# Patient Record
Sex: Male | Born: 1975 | Race: Asian | Hispanic: No | Marital: Married | Smoking: Never smoker
Health system: Southern US, Community
[De-identification: ages and names within clinical notes are randomized; demographics above are authoritative.]

---

## 2018-02-09 ENCOUNTER — Encounter (HOSPITAL_COMMUNITY): Payer: Self-pay

## 2018-02-09 ENCOUNTER — Emergency Department (HOSPITAL_COMMUNITY)
Admission: EM | Admit: 2018-02-09 | Discharge: 2018-02-10 | Disposition: A | Discharge status: Home / Self Care | Payer: Self-pay | Attending: Emergency Medicine | Admitting: Emergency Medicine

## 2018-02-09 ENCOUNTER — Emergency Department (HOSPITAL_COMMUNITY): Payer: Self-pay

## 2018-02-09 DIAGNOSIS — Y929 Unspecified place or not applicable: Secondary | ICD-10-CM | POA: Insufficient documentation

## 2018-02-09 DIAGNOSIS — Y939 Activity, unspecified: Secondary | ICD-10-CM | POA: Insufficient documentation

## 2018-02-09 DIAGNOSIS — S0990XA Unspecified injury of head, initial encounter: Secondary | ICD-10-CM | POA: Insufficient documentation

## 2018-02-09 DIAGNOSIS — W01190A Fall on same level from slipping, tripping and stumbling with subsequent striking against furniture, initial encounter: Secondary | ICD-10-CM | POA: Insufficient documentation

## 2018-02-09 DIAGNOSIS — W08XXXA Fall from other furniture, initial encounter: Secondary | ICD-10-CM | POA: Insufficient documentation

## 2018-02-09 DIAGNOSIS — Y999 Unspecified external cause status: Secondary | ICD-10-CM | POA: Insufficient documentation

## 2018-02-09 DIAGNOSIS — R296 Repeated falls: Secondary | ICD-10-CM

## 2018-02-09 LAB — BASIC METABOLIC PANEL
Anion gap: 13 (ref 5–15)
CO2: 25 mmol/L (ref 22–32)
Calcium: 9.1 mg/dL (ref 8.9–10.3)
Chloride: 99 mmol/L — ABNORMAL LOW (ref 101–111)
Creatinine, Ser: 0.93 mg/dL (ref 0.61–1.24)
GFR calc Af Amer: >60 mL/min (ref >60)
GLUCOSE: 104 mg/dL — AB (ref 65–99)
Potassium: 3.5 mmol/L (ref 3.5–5.1)
Sodium: 137 mmol/L (ref 135–145)

## 2018-02-09 LAB — CBC
HEMATOCRIT: 43.9 % (ref 39.0–52.0)
Hemoglobin: 14.7 g/dL (ref 13.0–17.0)
MCH: 33.2 pg (ref 26.0–34.0)
MCHC: 33.5 g/dL (ref 30.0–36.0)
MCV: 99.1 fL (ref 78.0–100.0)
Platelets: 113 10*3/uL — ABNORMAL LOW (ref 150–400)
RBC: 4.43 MIL/uL (ref 4.22–5.81)
RDW: 13.4 % (ref 11.5–15.5)
WBC: 7.3 10*3/uL (ref 4.0–10.5)

## 2018-02-09 LAB — I-STAT TROPONIN, ED: Troponin i, poc: 0 ng/mL (ref 0.00–0.08)

## 2018-02-09 LAB — D-DIMER, QUANTITATIVE: D-Dimer, Quant: 0.27 ug/mL-FEU (ref 0.00–0.50)

## 2018-02-09 NOTE — ED Triage Notes (Signed)
Pt. Arrived by EMS with reports of syncopal episode. Per family pt. Was sitting on the couch and fell and hit the left side of his face on a coffee table. Pt. Does not remember falling and was an unwitnessed fall. C-collar in place. Pt. A/o x4  Pt. Oleh GeninSpeaks Karemai, translator used during triage.  cbg 95

## 2018-02-09 NOTE — ED Notes (Signed)
Pt. Return from XRAY via stretcher. 

## 2018-02-09 NOTE — ED Provider Notes (Signed)
[facility] EMERGENCY DEPARTMENT Provider Note   CSN: [id number] Arrival date & time: 02/09/18  2057     History   Chief Complaint Chief Complaint  Patient presents with  . Loss of Consciousness    HPI Kiyan Burmester is a 42 y.o. male.  Urbano Seivert is a 42 y.o. Male who is otherwise healthy, presents to the emergency department via EMS for evaluation of unwitnessed fall.  Patient was on the couch and fell hitting the left side of his face on a coffee table, patient does not remember falling, family came in shortly after hearing him fall he was found on the floor awake.  Patient has not had any vomiting since hitting his head, is alert and oriented and responsive, complaining primarily of pain around the left cheek and eye where there is notable swelling, no lacerations reported.  Patient denies any pain elsewhere aside from his head and neck.  Patient did not have any prodrome of chest pain or shortness of breath prior to falling.  Patient's son comments that he just flew in from [place] this morning and has not had much sleep and he thinks the patient may have fallen asleep and fallen off the couch.  Patient denies any vision changes, numbness tingling or weakness.  Denies any current chest pain, shortness of breath or abdominal pain.  Patient denies any pain or lower extremity swelling.  Patient speaks Clydie Braun, history obtained with help of son, and interpreter.     History reviewed. No pertinent past medical history.  There are no active problems to display for this patient.   History reviewed. No pertinent surgical history.      Home Medications    Prior to Admission medications   Not on File    Family History History reviewed. No pertinent family history.  Social History Social History   Tobacco Use  . Smoking status: Never Smoker  . Smokeless tobacco: Never Used  Substance Use Topics  . Alcohol use: Not on file  . Drug use: Not on file     Allergies     Patient has no known allergies.   Review of Systems Review of Systems  Constitutional: Negative for chills and fever.  HENT: Positive for facial swelling.   Eyes: Positive for redness. Negative for photophobia and visual disturbance.  Respiratory: Negative for cough and shortness of breath.   Cardiovascular: Negative for chest pain.  Gastrointestinal: Negative for abdominal pain, nausea and vomiting.  Genitourinary: Negative for dysuria and frequency.  Musculoskeletal: Positive for neck pain. Negative for arthralgias, back pain and joint swelling.  Skin: Negative for color change and rash.  Neurological: Positive for headaches. Negative for dizziness, weakness, light-headedness and numbness.     Physical Exam Updated Vital Signs BP (!) 168/98   Pulse 94   Temp 98.6 F (37 C) (Oral)   Resp 17   SpO2 97%   Physical Exam  Constitutional: He is oriented to person, place, and time. He appears well-developed and well-nourished. No distress.  HENT:  Head: Normocephalic.  Tenderness and swelling above and below the left eye, no obvious palpable bony deformity, no swelling or tenderness over the scalp, no appreciable step-off or deformity, no hemotympanum or CSF otorrhea, normal jaw occlusion  Eyes: Right eye exhibits no discharge. Left eye exhibits no discharge.  Small subconjunctival hemorrhage of the left eye around the outer canthus, no evidence of hyphema, right eye unremarkable  Neck:  C-collar in place, focal tenderness over the midline  of the C-spine without palpable deformity  Cardiovascular: Normal rate, regular rhythm, normal heart sounds and intact distal pulses.  Pulmonary/Chest: Effort normal and breath sounds normal. No stridor. No respiratory distress. He has no wheezes. He has no rales.  Respirations equal and unlabored, patient able to speak in full sentences, lungs clear to auscultation bilaterally  Abdominal: Soft. Bowel sounds are normal. He exhibits no distension  and no mass. There is no tenderness. There is no guarding.  Abdomen soft, nondistended, nontender to palpation in all quadrants without guarding or peritoneal signs  Musculoskeletal:  T-spine and L-spine nontender to palpation at midline. Patient moves all extremities without difficulty. All joints supple and easily movable, no erythema, swelling or palpable deformity, all compartments soft.  Neurological: He is alert and oriented to person, place, and time. Coordination normal.  Speech is clear, able to follow commands CN III-XII intact Normal strength in upper and lower extremities bilaterally including dorsiflexion and plantar flexion, strong and equal grip strength Sensation normal to light and sharp touch Moves extremities without ataxia, coordination intact Normal finger to nose and rapid alternating movements No pronator drift  Skin: Skin is warm and dry. Capillary refill takes less than 2 seconds. He is not diaphoretic.  Psychiatric: He has a normal mood and affect. His behavior is normal.  Nursing note and vitals reviewed.    ED Treatments / Results  Labs (all labs ordered are listed, but only abnormal results are displayed) Labs Reviewed  BASIC METABOLIC PANEL - Abnormal; Notable for the following components:      Result Value   Chloride 99 (*)    Glucose, Bld 104 (*)    BUN <5 (*)    All other components within normal limits  CBC - Abnormal; Notable for the following components:   Platelets 113 (*)    All other components within normal limits  URINALYSIS, ROUTINE W REFLEX MICROSCOPIC - Abnormal; Notable for the following components:   Hgb urine dipstick SMALL (*)    Ketones, ur 20 (*)    Bacteria, UA RARE (*)    All other components within normal limits  D-DIMER, QUANTITATIVE (NOT AT [facility])  I-STAT TROPONIN, ED  CBG MONITORING, ED    EKG EKG Interpretation  Date/Time:  Friday February 09 2018 21:04:08 EDT Ventricular Rate:  89 PR Interval:    QRS  Duration: 92 QT Interval:  380 QTC Calculation: 463 R Axis:   100 Text Interpretation:  Sinus rhythm Right axis deviation Confirmed by Rolland Porter ([id number]) on 02/09/2018 9:13:15 PM   Radiology Dg Chest 2 View  Result Date: 02/09/2018 CLINICAL DATA:  Syncopal episode. Fell off the couch onto the left side. Unwitnessed fall. EXAM: CHEST - 2 VIEW COMPARISON:  None. FINDINGS: Normal heart size and pulmonary vascularity. No focal airspace disease or consolidation in the lungs. No blunting of costophrenic angles. No pneumothorax. Mediastinal contours appear intact. IMPRESSION: No active cardiopulmonary disease. Electronically Signed   By: Burman Nieves M.D.   On: 02/09/2018 22:51   Ct Head Wo Contrast  Result Date: 02/10/2018 CLINICAL DATA:  Patient fell over while sitting on the sofa. Struck left side of face. Loss consciousness. EXAM: CT HEAD WITHOUT CONTRAST CT MAXILLOFACIAL WITHOUT CONTRAST CT CERVICAL SPINE WITHOUT CONTRAST TECHNIQUE: Multidetector CT imaging of the head, cervical spine, and maxillofacial structures were performed using the standard protocol without intravenous contrast. Multiplanar CT image reconstructions of the cervical spine and maxillofacial structures were also generated. COMPARISON:  None. FINDINGS: CT HEAD FINDINGS Brain:  No evidence of acute infarction, hemorrhage, hydrocephalus, extra-axial collection or mass lesion/mass effect. Vascular: No hyperdense vessel or unexpected calcification. Skull: Normal. Negative for fracture or focal lesion. Other: None. CT MAXILLOFACIAL FINDINGS Osseous: No fracture or mandibular dislocation. No destructive process. Degenerative changes in the temporomandibular joints. Orbits: Negative. No traumatic or inflammatory finding. Sinuses: Mucosal thickening in the paranasal sinuses. No acute air-fluid levels. Mastoid air cells are clear. Soft tissues: Soft tissue swelling or hematoma over the left anterior maxillary region. CT CERVICAL SPINE  FINDINGS Alignment: Normal. Skull base and vertebrae: No acute fracture. No primary bone lesion or focal pathologic process. Soft tissues and spinal canal: No prevertebral fluid or swelling. No visible canal hematoma. Disc levels: Intervertebral disc space heights are preserved. Hypertrophic degenerative changes most prominent at C5-6, C6-7, and C7-T1 levels. Upper chest: Lung apices are clear. Other: None. IMPRESSION: 1. No acute intracranial abnormalities. 2. No acute orbital or facial fractures. 3. Soft tissue swelling or hematoma over the left anterior maxillary region. 4. Degenerative changes in the temporomandibular joints. 5. Mucosal thickening in the paranasal sinuses, likely chronic. 6. Normal alignment of the cervical spine. No acute displaced fractures identified. Mild degenerative changes. Electronically Signed   By: Burman NievesWilliam  Stevens M.D.   On: 02/10/2018 00:42   Ct Cervical Spine Wo Contrast  Result Date: 02/10/2018 CLINICAL DATA:  Patient fell over while sitting on the sofa. Struck left side of face. Loss consciousness. EXAM: CT HEAD WITHOUT CONTRAST CT MAXILLOFACIAL WITHOUT CONTRAST CT CERVICAL SPINE WITHOUT CONTRAST TECHNIQUE: Multidetector CT imaging of the head, cervical spine, and maxillofacial structures were performed using the standard protocol without intravenous contrast. Multiplanar CT image reconstructions of the cervical spine and maxillofacial structures were also generated. COMPARISON:  None. FINDINGS: CT HEAD FINDINGS Brain: No evidence of acute infarction, hemorrhage, hydrocephalus, extra-axial collection or mass lesion/mass effect. Vascular: No hyperdense vessel or unexpected calcification. Skull: Normal. Negative for fracture or focal lesion. Other: None. CT MAXILLOFACIAL FINDINGS Osseous: No fracture or mandibular dislocation. No destructive process. Degenerative changes in the temporomandibular joints. Orbits: Negative. No traumatic or inflammatory finding. Sinuses: Mucosal  thickening in the paranasal sinuses. No acute air-fluid levels. Mastoid air cells are clear. Soft tissues: Soft tissue swelling or hematoma over the left anterior maxillary region. CT CERVICAL SPINE FINDINGS Alignment: Normal. Skull base and vertebrae: No acute fracture. No primary bone lesion or focal pathologic process. Soft tissues and spinal canal: No prevertebral fluid or swelling. No visible canal hematoma. Disc levels: Intervertebral disc space heights are preserved. Hypertrophic degenerative changes most prominent at C5-6, C6-7, and C7-T1 levels. Upper chest: Lung apices are clear. Other: None. IMPRESSION: 1. No acute intracranial abnormalities. 2. No acute orbital or facial fractures. 3. Soft tissue swelling or hematoma over the left anterior maxillary region. 4. Degenerative changes in the temporomandibular joints. 5. Mucosal thickening in the paranasal sinuses, likely chronic. 6. Normal alignment of the cervical spine. No acute displaced fractures identified. Mild degenerative changes. Electronically Signed   By: Burman NievesWilliam  Stevens M.D.   On: 02/10/2018 00:42   Ct Maxillofacial Wo Contrast  Result Date: 02/10/2018 CLINICAL DATA:  Patient fell over while sitting on the sofa. Struck left side of face. Loss consciousness. EXAM: CT HEAD WITHOUT CONTRAST CT MAXILLOFACIAL WITHOUT CONTRAST CT CERVICAL SPINE WITHOUT CONTRAST TECHNIQUE: Multidetector CT imaging of the head, cervical spine, and maxillofacial structures were performed using the standard protocol without intravenous contrast. Multiplanar CT image reconstructions of the cervical spine and maxillofacial structures were also generated. COMPARISON:  None.  FINDINGS: CT HEAD FINDINGS Brain: No evidence of acute infarction, hemorrhage, hydrocephalus, extra-axial collection or mass lesion/mass effect. Vascular: No hyperdense vessel or unexpected calcification. Skull: Normal. Negative for fracture or focal lesion. Other: None. CT MAXILLOFACIAL FINDINGS  Osseous: No fracture or mandibular dislocation. No destructive process. Degenerative changes in the temporomandibular joints. Orbits: Negative. No traumatic or inflammatory finding. Sinuses: Mucosal thickening in the paranasal sinuses. No acute air-fluid levels. Mastoid air cells are clear. Soft tissues: Soft tissue swelling or hematoma over the left anterior maxillary region. CT CERVICAL SPINE FINDINGS Alignment: Normal. Skull base and vertebrae: No acute fracture. No primary bone lesion or focal pathologic process. Soft tissues and spinal canal: No prevertebral fluid or swelling. No visible canal hematoma. Disc levels: Intervertebral disc space heights are preserved. Hypertrophic degenerative changes most prominent at C5-6, C6-7, and C7-T1 levels. Upper chest: Lung apices are clear. Other: None. IMPRESSION: 1. No acute intracranial abnormalities. 2. No acute orbital or facial fractures. 3. Soft tissue swelling or hematoma over the left anterior maxillary region. 4. Degenerative changes in the temporomandibular joints. 5. Mucosal thickening in the paranasal sinuses, likely chronic. 6. Normal alignment of the cervical spine. No acute displaced fractures identified. Mild degenerative changes. Electronically Signed   By: Burman Nieves M.D.   On: 02/10/2018 00:42    Procedures Procedures (including critical care time)  Medications Ordered in ED Medications - No data to display   Initial Impression / Assessment and Plan / ED Course  I have reviewed the triage vital signs and the nursing notes.  Pertinent labs & imaging results that were available during my care of the patient were reviewed by me and considered in my medical decision making (see chart for details).  Patient is emergency department for evaluation of unwitnessed fall, he hit the left side of his head on a coffee table, possible syncopal episode versus son concerned that he fell asleep and fell over as patient has had very little sleep  today after traveling from [place].  Patient is alert and oriented currently, but does not remember the fall.  He denies any current chest pain or shortness of breath or any preceding the event.  Reports mild headache and pain surrounding the left face, no vision changes, vomiting or dizziness.  Normal neurologic exam, swelling surrounding the left eye no obvious bony deformity, there is a small subconjunctival hemorrhage of the left eye with no evidence of hyphema.  Tenderness to palpation of the C-spine will get CTs of the head, maxillofacial and C-spine.  Labs, EKG and chest x-ray ordered from triage.  Will also get d-dimer given patient's recent plane travel with potential syncopal event.  CT is of the head face and neck show no evidence of significant trauma aside from superficial hematoma over the left cheek.  Labs overall very reassuring, EKG without concerning ischemic changes, negative troponin, negative d-dimer.  No leukocytosis, normal hemoglobin, no acute electrolyte derangements, normal renal function, no evidence of urinary tract infection.  Discussed these reassuring results with patient and family.  Patient able to ambulate in the department without any symptoms or difficulty.  At this time I feel patient is stable for discharge home with outpatient follow-up return precautions discussed.  Patient expresses understanding and is in agreement with plan.  Final Clinical Impressions(s) / ED Diagnoses   Final diagnoses:  Unwitnessed fall  Injury of head, initial encounter    ED Discharge Orders    None       Dartha Lodge, [place]  02/10/18 1310    Rolland Porter, MD 02/16/18 [phone]

## 2018-02-10 LAB — URINALYSIS, ROUTINE W REFLEX MICROSCOPIC
BILIRUBIN URINE: NEGATIVE
GLUCOSE, UA: NEGATIVE mg/dL
KETONES UR: 20 mg/dL — AB
LEUKOCYTES UA: NEGATIVE
NITRITE: NEGATIVE
PROTEIN: NEGATIVE mg/dL
Specific Gravity, Urine: 1.013 (ref 1.005–1.030)
pH: 7 (ref 5.0–8.0)

## 2018-02-10 NOTE — ED Notes (Signed)
Pt ambulated to BR, tolerated well

## 2018-02-10 NOTE — Discharge Instructions (Signed)
Work-up today is very reassuring, lab work, EKG and imaging of the head neck and chest look good.  Patient could have passed out versus fallen asleep causing his fall today.  Please follow-up with the [facility], return to the emergency department for any further episodes of passing out, falls, if he complains of severe headache, vomiting, chest pain, shortness of breath or any other new or concerning symptoms.

## 2019-11-11 IMAGING — CT CT CERVICAL SPINE W/O CM
4 of 10 series · 8 of 33 positions shown, 9 images · non-contrast
Comparison: None.

CLINICAL DATA: Patient fell over while sitting on the sofa. Struck
left side of face. Loss consciousness.

EXAM:
CT HEAD WITHOUT CONTRAST
CT MAXILLOFACIAL WITHOUT CONTRAST
CT CERVICAL SPINE WITHOUT CONTRAST
TECHNIQUE: Multidetector CT imaging of the head, cervical spine, and
maxillofacial structures were performed using the standard protocol
without intravenous contrast. Multiplanar CT image reconstructions
of the cervical spine and maxillofacial structures were also
generated.

[Series 7: facial/ orbits 2.0 h30s · axial · 0.39mm/px · z∈[-134,-84]mm · 2 of 77 slices shown]
[im 26/77  bone]
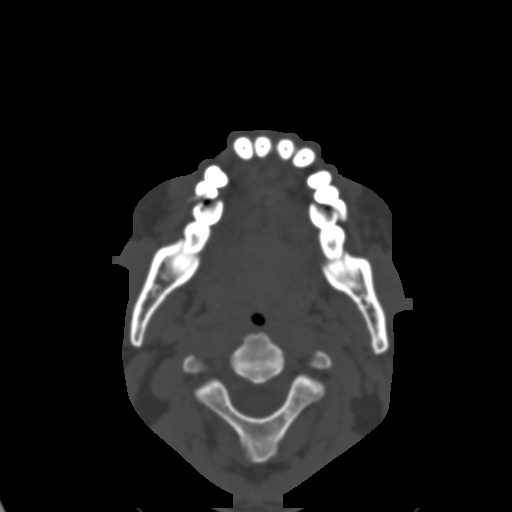
[im 51/77  bone]
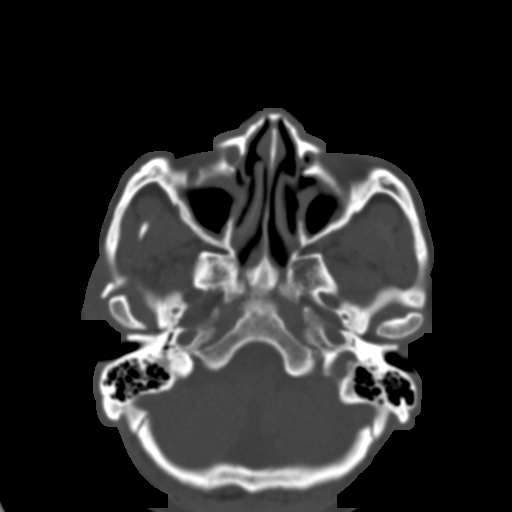

[Series 16: c_spine 2.0 3 st · axial · 0.24mm/px · z∈[-226,-130]mm · 3 of 98 slices shown, 4 images]
[im 25/98  soft-tissue]
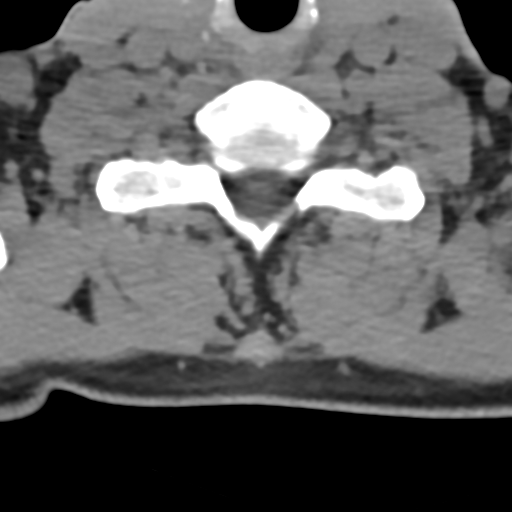
[im 25/98  bone]
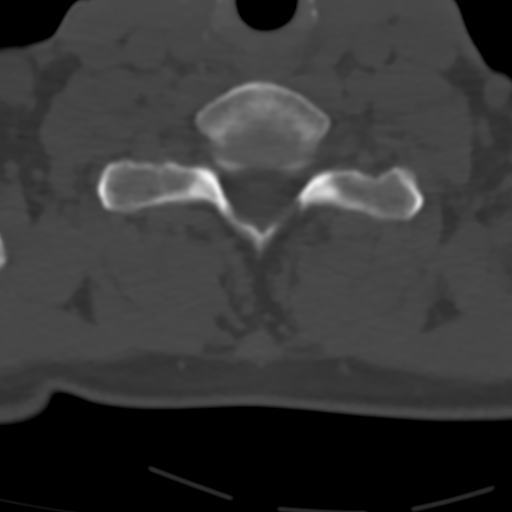
[im 49/98  bone]
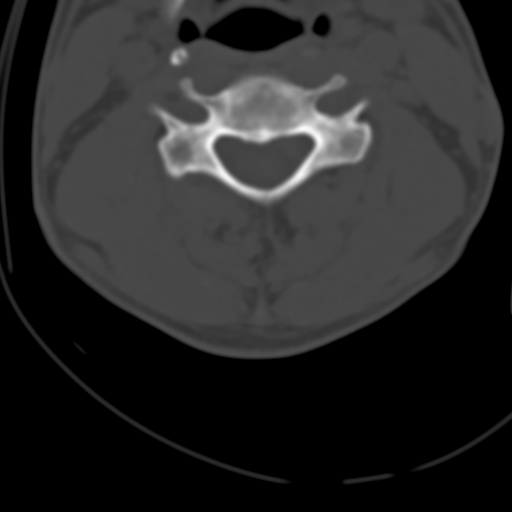
[im 73/98  bone]
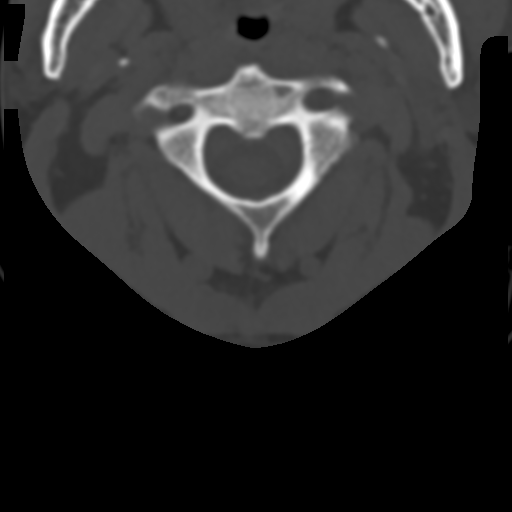

[Series 21: sagittal bone · sagittal · 0.25mm/px · 1 of 43 slices shown]
[im 22/43  bone]
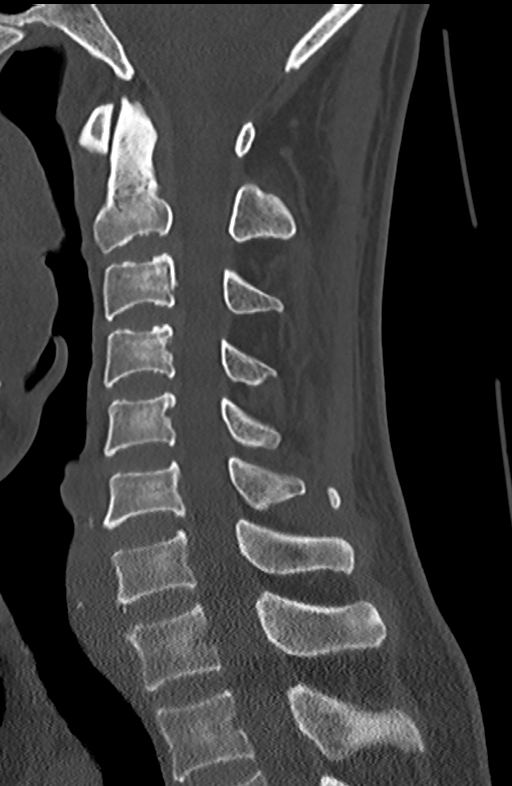

[Series 23: orthogonal axials st · axial · 0.21mm/px · z∈[-219,-159]mm · 2 of 97 slices shown]
[im 33/97  bone]
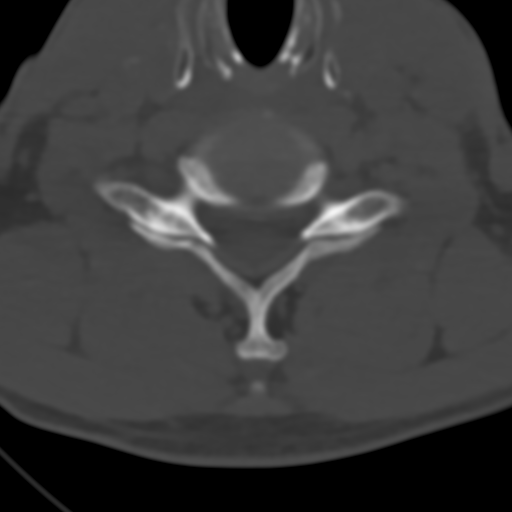
[im 65/97  bone]
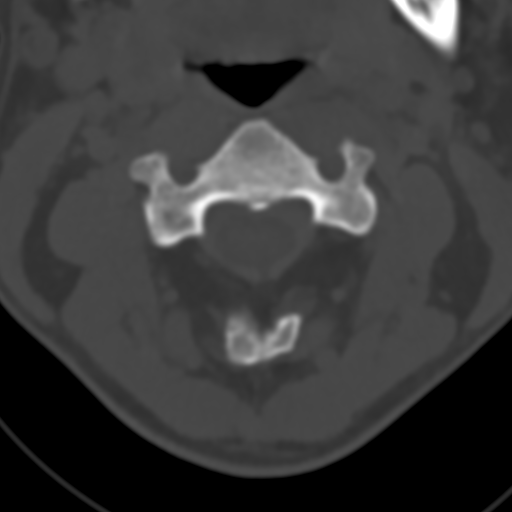

[8 of 33 positions shown; findings below may reference images not displayed]

FINDINGS: CT HEAD FINDINGS

Brain: No evidence of acute infarction, hemorrhage, hydrocephalus,
extra-axial collection or mass lesion/mass effect.

Vascular: No hyperdense vessel or unexpected calcification.

Skull: Normal. Negative for fracture or focal lesion.

Other: None.

CT MAXILLOFACIAL FINDINGS

Osseous: No fracture or mandibular dislocation. No destructive
process. Degenerative changes in the temporomandibular joints.

Orbits: Negative. No traumatic or inflammatory finding.

Sinuses: Mucosal thickening in the paranasal sinuses. No acute
air-fluid levels. Mastoid air cells are clear.

Soft tissues: Soft tissue swelling or hematoma over the left
anterior maxillary region.

CT CERVICAL SPINE FINDINGS

Alignment: Normal.

Skull base and vertebrae: No acute fracture. No primary bone lesion
or focal pathologic process.

Soft tissues and spinal canal: No prevertebral fluid or swelling. No
visible canal hematoma.

Disc levels: Intervertebral disc space heights are preserved.
Hypertrophic degenerative changes most prominent at C5-6, C6-7, and
C7-T1 levels.

Upper chest: Lung apices are clear.

Other: None.
IMPRESSION: 1. No acute intracranial abnormalities.
2. No acute orbital or facial fractures.
3. Soft tissue swelling or hematoma over the left anterior maxillary
region.
4. Degenerative changes in the temporomandibular joints.
5. Mucosal thickening in the paranasal sinuses, likely chronic.
6. Normal alignment of the cervical spine. No acute displaced
fractures identified. Mild degenerative changes.
